# Patient Record
Sex: Male | Born: 1985 | Race: White | Hispanic: No | Marital: Married | Smoking: Never smoker
Health system: Southern US, Community
[De-identification: ages and names within clinical notes are randomized; demographics above are authoritative.]

## PROBLEM LIST (undated history)

## (undated) DIAGNOSIS — E785 Hyperlipidemia, unspecified: Secondary | ICD-10-CM

## (undated) DIAGNOSIS — I10 Essential (primary) hypertension: Secondary | ICD-10-CM

## (undated) DIAGNOSIS — F32A Depression, unspecified: Secondary | ICD-10-CM

## (undated) DIAGNOSIS — E119 Type 2 diabetes mellitus without complications: Secondary | ICD-10-CM

## (undated) HISTORY — DX: Hyperlipidemia, unspecified: E78.5

## (undated) HISTORY — DX: Depression, unspecified: F32.A

## (undated) HISTORY — PX: WISDOM TOOTH EXTRACTION: SHX21

## (undated) HISTORY — PX: C-EYE SURGERY PROCEDURE: 102257504

## (undated) HISTORY — PX: TONSILLECTOMY: SUR1361

## (undated) HISTORY — DX: Type 2 diabetes mellitus without complications: E11.9

## (undated) HISTORY — DX: Essential (primary) hypertension: I10

---

## 2010-05-09 HISTORY — PX: EYE SURGERY: SHX253

## 2017-07-09 ENCOUNTER — Emergency Department (HOSPITAL_COMMUNITY)
Admission: EM | Admit: 2017-07-09 | Discharge: 2017-07-09 | Disposition: A | Discharge status: Home / Self Care | Payer: 59 | Attending: Emergency Medicine | Admitting: Emergency Medicine

## 2017-07-09 ENCOUNTER — Emergency Department (HOSPITAL_COMMUNITY): Payer: 59

## 2017-07-09 ENCOUNTER — Encounter (HOSPITAL_COMMUNITY): Payer: Self-pay | Admitting: *Deleted

## 2017-07-09 DIAGNOSIS — R030 Elevated blood-pressure reading, without diagnosis of hypertension: Secondary | ICD-10-CM | POA: Insufficient documentation

## 2017-07-09 DIAGNOSIS — H05229 Edema of unspecified orbit: Secondary | ICD-10-CM | POA: Diagnosis present

## 2017-07-09 DIAGNOSIS — L03213 Periorbital cellulitis: Secondary | ICD-10-CM | POA: Diagnosis not present

## 2017-07-09 LAB — I-STAT CHEM 8, ED
BUN: 13 mg/dL (ref 6–20)
Calcium, Ion: 1.15 mmol/L (ref 1.15–1.40)
Chloride: 101 mmol/L (ref 101–111)
Creatinine, Ser: 1 mg/dL (ref 0.61–1.24)
GLUCOSE: 113 mg/dL — AB (ref 65–99)
HCT: 51 % (ref 39.0–52.0)
HEMOGLOBIN: 17.3 g/dL — AB (ref 13.0–17.0)
POTASSIUM: 3.9 mmol/L (ref 3.5–5.1)
SODIUM: 140 mmol/L (ref 135–145)
TCO2: 28 mmol/L (ref 22–32)

## 2017-07-09 MED ORDER — CLINDAMYCIN HCL 300 MG PO CAPS
300.0000 mg | ORAL_CAPSULE | Freq: Once | ORAL | Status: AC
  Administered 2017-07-09: 300 mg via ORAL
  Filled 2017-07-09: qty 1

## 2017-07-09 MED ORDER — CLINDAMYCIN HCL 300 MG PO CAPS: 300.0000 mg | ORAL_CAPSULE | Freq: Three times a day (TID) | ORAL | 0 refills | Status: AC

## 2017-07-09 MED ORDER — IOPAMIDOL (ISOVUE-300) INJECTION 61%
75.0000 mL | Freq: Once | INTRAVENOUS | Status: AC | PRN
  Administered 2017-07-09: 75 mL via INTRAVENOUS

## 2017-07-09 MED ORDER — IOPAMIDOL (ISOVUE-300) INJECTION 61%
INTRAVENOUS | Status: AC
  Administered 2017-07-09: 16:00:00
  Filled 2017-07-09: qty 75

## 2017-07-09 NOTE — Discharge Instructions (Signed)
Please see the information and instructions below regarding your visit.  Your diagnoses today include:  1. Preseptal cellulitis of left eye    Cellulitis is a superficial skin infection. Please take your antibiotics as prescribed for their ENTIRE prescribed duration.   Tests performed today include: See side panel of your discharge paperwork for testing performed today. Vital signs are listed at the bottom of these instructions.   Medications prescribed:    Take any prescribed medications only as prescribed, and any over the counter medications only as directed on the packaging.  1. Please take all of your antibiotics until finished.   You may develop abdominal discomfort or nausea from the antibiotic. If this occurs, you may take it with food. Some patients also get diarrhea with antibiotics. You may help offset this with probiotics which you can buy or get in yogurt. Do not eat or take the probiotics until 2 hours after your antibiotic. Some women develop vaginal yeast infections after antibiotics. If you develop unusual vaginal discharge after being on this medication, please see your primary care provider.   Some people develop allergies to antibiotics. Symptoms of antibiotic allergy can be mild and include a flat rash and itching. They can also be more serious and include:  ?Hives - Hives are raised, red patches of skin that are usually very itchy.  ?Lip or tongue swelling  ?Trouble swallowing or breathing  ?Blistering of the skin or mouth.  If you have any of these serious symptoms, please seek emergency medical care immediately.  Home care instructions:  Please follow any educational materials contained in this packet.   Please ensure that you apply warm compresses.  This will promote drainage.  Follow-up instructions: Please follow-up with Dr. Vanessa BarbaraZamora of ophthalmology tomorrow.  I discussed her case with him, and his office will contact you tomorrow.  I also sent your chart  to his in box.  Return instructions:  Please return to the Emergency Department if you experience worsening symptoms. Call your doctor sooner or return to the ER if you develop worsening signs of infection such as: increased redness, increased pain, pus, fever, or other symptoms that concern you. Please monitor the area we marked with a pen today. Please return if you have any other emergent concerns.  Additional Information:   Your vital signs today were: BP (!) 170/124 (BP Location: Left Arm)    Pulse 93    Temp 98.5 F (36.9 C) (Oral)    Ht 6' (1.829 m)    Wt 124.7 kg (275 lb)    SpO2 96%    BMI 37.30 kg/m  If your blood pressure (BP) was elevated on multiple readings during this visit above 130 for the top number or above 80 for the bottom number, please have this repeated by your primary care provider within one month. --------------  Thank you for allowing us to participate in your care today. It was a pleasure taking care of you today!

## 2017-07-09 NOTE — ED Notes (Signed)
Bed: WTR6 Expected date:  Expected time:  Means of arrival:  Comments: 

## 2017-07-09 NOTE — ED Provider Notes (Addendum)
[facility]-EMERGENCY DEPT Provider Note   CSN: [id number] Arrival date & time: 07/09/17  0932     History   Chief Complaint Chief Complaint  Patient presents with  . Abscess    HPI Bernadene BellMatthew Rackley is a 32 y.o. male.  HPI  Patient is a 32 year old male with a history of Crohn's keratosis of bilateral eyes presenting for periorbital swelling and erythema of the left eye.  Patient reports he has had swelling to the medial canthus for approximately 2 days, very rapidly progressed overnight.  Patient reports there is a fluctuant nodule in the right side of the eye.  Patient reports he has been recovering from an upper respiratory infection with sinus congestion.  Patient reports that he has had chronic sinus issues.  Patient denies fevers, chills, pain with extraocular movements, extraocular motion entrapment, retro-orbital pain.  eHistory reviewed. No pertinent past medical history.  There are no active problems to display for this patient.   History reviewed. No pertinent surgical history.     Home Medications    Prior to Admission medications   Not on File    Family History No family history on file.  Social History Social History   Tobacco Use  . Smoking status: Never Smoker  . Smokeless tobacco: Never Used  Substance Use Topics  . Alcohol use: Yes    Frequency: Never  . Drug use: Not on file     Allergies   Patient has no allergy information on record.   Review of Systems Review of Systems  Constitutional: Negative for chills and fever.  HENT: Positive for congestion, facial swelling and rhinorrhea.   Eyes: Positive for redness. Negative for visual disturbance.  Gastrointestinal: Negative for nausea and vomiting.  Neurological: Negative for headaches.     Physical Exam Updated Vital Signs BP (!) 170/124 (BP Location: Left Arm)   Pulse 93   Temp 98.5 F (36.9 C) (Oral)   Ht 6' (1.829 m)   Wt 124.7 kg (275 lb)   SpO2 96%    BMI 37.30 kg/m   Physical Exam  Constitutional: He appears well-developed and well-nourished. No distress.  Sitting comfortably in bed.  HENT:  Head: Normocephalic and atraumatic.  Eyes: Conjunctivae and EOM are normal. Pupils are equal, round, and reactive to light. Right eye exhibits no discharge. Left eye exhibits no discharge.  See clinical photo.  There is a fluctuant nodule at the medial canthus of the left eye.  Periorbital edema and erythema extends to the left upper lid.  No extraocular movement entrapment or pain with extraocular muscles.   Neck: Normal range of motion.  Cardiovascular: Normal rate and regular rhythm.  Intact, 2+ radial pulse.  Non-tachycardic.  Pulmonary/Chest:  Normal respiratory effort. Patient converses comfortably. No audible wheeze or stridor.  Abdominal: He exhibits no distension.  Musculoskeletal: Normal range of motion.  Neurological: He is alert.  Cranial nerves intact to gross observation. Patient moves extremities without difficulty.  Skin: Skin is warm and dry. He is not diaphoretic.  Psychiatric: He has a normal mood and affect. His behavior is normal. Judgment and thought content normal.  Nursing note and vitals reviewed.      ED Treatments / Results  Labs (all labs ordered are listed, but only abnormal results are displayed) Labs Reviewed - No data to display  EKG  EKG Interpretation None       Radiology No results found.  Procedures Procedures (including critical care time)  Medications Ordered in ED  Medications - No data to display   Initial Impression / Assessment and Plan / ED Course  I have reviewed the triage vital signs and the nursing notes.  Pertinent labs & imaging results that were available during my care of the patient were reviewed by me and considered in my medical decision making (see chart for details).    Patient is nontoxic-appearing, afebrile, and in no acute distress.  Patient exhibits left  periorbital erythema and edema as well as a nodular lesion concerning for possible fluid collection at the medial canthus.    Spoke with Dr. Vanessa Barbara of ophthalmology, who reviewed the clinical photos and recommended CT orbits as well as follow-up in the office tomorrow.  Patient was instructed that he is to follow-up, and that the office will contact him.  Patient given 1 dose of clindamycin in emergency department, and given prescription of clindamycin for preseptal cellulitis.  Patient given return precautions for any interim acute ocular movement entrapment, fevers, chills, rapid extension.  Site outlined in emergency department today.  CT scan, reviewed by me demonstrates preseptal cellulitis with no evidence of orbital involvement.  Patient exhibits retention cyst of the sinuses.  I noted the elevated blood pressure to the patient.  Patient reports that he and his wife monitor at home, and he will follow-up with his primary care provider.  Patient denies any visual changes other than the sensation in his left eye, headaches chest pain, shortness breath.  This is a shared visit with Dr. Alona Bene. Patient was independently evaluated by this attending physician. Attending physician consulted in evaluation and discharge management.  Final Clinical Impressions(s) / ED Diagnoses   Final diagnoses:  Preseptal cellulitis of left eye  Elevated blood-pressure reading without diagnosis of hypertension    ED Discharge Orders    None       Delia Chimes 07/09/17 1709    Elisha Ponder, PA-C 07/09/17 1709    Maia Plan, MD 07/09/17 2022

## 2017-07-09 NOTE — ED Triage Notes (Signed)
Pt complains of boil to the right of his left eye for the past 2 days. Pt states the area became swollen this morning, spreading to his left eyelid.

## 2017-07-10 ENCOUNTER — Ambulatory Visit (INDEPENDENT_AMBULATORY_CARE_PROVIDER_SITE_OTHER): Payer: 59 | Admitting: Ophthalmology

## 2017-07-10 ENCOUNTER — Encounter (INDEPENDENT_AMBULATORY_CARE_PROVIDER_SITE_OTHER): Payer: Self-pay | Admitting: Ophthalmology

## 2017-07-10 DIAGNOSIS — H3581 Retinal edema: Secondary | ICD-10-CM | POA: Diagnosis not present

## 2017-07-10 DIAGNOSIS — H18603 Keratoconus, unspecified, bilateral: Secondary | ICD-10-CM

## 2017-07-10 DIAGNOSIS — L03213 Periorbital cellulitis: Secondary | ICD-10-CM | POA: Diagnosis not present

## 2017-07-10 NOTE — Progress Notes (Signed)
[facility] - Clinic Note  07/10/2017     CHIEF COMPLAINT Patient presents for Retina Evaluation   HISTORY OF PRESENT ILLNESS: Francis Frank is a 32 y.o. male who presents to the clinic today for:   HPI    Retina Evaluation    In left eye.  This started 3 days ago.  Associated Symptoms Floaters.  Negative for Flashes, Pain, Trauma, Fever, Weight Loss, Scalp Tenderness, Redness, Distortion, Photophobia, Jaw Claudication, Fatigue, Shoulder/Hip pain, Glare and Blind Spot.  Context:  distance vision, mid-range vision and near vision.  Treatments tried include no treatments.  I, the attending physician,  performed the HPI with the patient and updated documentation appropriately.          Comments    Referral of [facility] , Dr. Mariel Aloe for eye issue.(Preseptal cellulitis). Patient states he noticed a sore spot coming up left side of his nose three days ago, he applied warm compresses to area, sore contiuned to get larger, he went to Ed yesterday and was given Clindamycin and to follow up appt with Dr. Vanessa Barbara . Pt reports he has a hx of Crohn's keratosis, pt had cornea scraping 2012. Pt reports he is not able to wear contact os. Pt reports occasional floaters os.  Denies ocular pain. Denies gtt's/vits       Last edited by Rennis Chris, MD on 07/10/2017  2:30 PM. 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 Charolette Forward in [place]; Pt reports having KCXL with Dr. Joice Lofts near [place]; Pt reports having intacs OS; Pt reports having mono VA;   Referring physician: No referring provider defined for this encounter.  HISTORICAL INFORMATION:   Selected notes from the MEDICAL RECORD NUMBER Referred by ED for preseptal cellulitis OS;  LEE-  Ocular Hx-  PMH-     CURRENT MEDICATIONS: No current outpatient medications on file. (Ophthalmic Drugs)   No current  facility-administered medications for this visit.  (Ophthalmic Drugs)   Current Outpatient Medications (Other)  Medication Sig  . clindamycin (CLEOCIN) 300 MG capsule Take 1 capsule (300 mg total) by mouth 3 (three) times daily for 7 days. You were given one dose in the emergency department.  Marland Kitchen FLUoxetine (PROZAC) 20 MG capsule Take 60 mg by mouth daily.   No current facility-administered medications for this visit.  (Other)      REVIEW OF SYSTEMS: ROS    Positive for: Genitourinary   Negative for: Constitutional, Gastrointestinal, Neurological, Skin, Musculoskeletal, HENT, Endocrine, Cardiovascular, Eyes, Respiratory, Psychiatric, Allergic/Imm, Heme/Lymph   Last edited by Eldridge Scot, LPN on 01/07/4781  1:58 PM. (History)       ALLERGIES No Known Allergies  PAST MEDICAL HISTORY History reviewed. No pertinent past medical history. Past Surgical History:  Procedure Laterality Date  . C-EYE SURGERY PROCEDURE    . EYE SURGERY  2012   cornea scrapping  . TONSILLECTOMY    . WISDOM TOOTH EXTRACTION      FAMILY HISTORY Family History  Problem Relation Age of Onset  . Hypertension Mother   . Diabetes Father   . Hypertension Father   . Diabetes Maternal Grandfather   . Stroke Maternal Grandfather   . Diabetes Paternal Grandfather     SOCIAL HISTORY Social History   Tobacco Use  . Smoking status: Never Smoker  . Smokeless tobacco: Never Used  Substance Use Topics  . Alcohol use: Yes    Alcohol/week: 0.6 oz    Types: 1 Cans of beer per week    Frequency: Never  . Drug use: No         OPHTHALMIC EXAM:  Base Eye Exam    Visual Acuity (Snellen - Linear)      Right Left   Dist [place] 20/100 20/150 -1   Dist ph [place] 20/30 -1 20/60 -2       Tonometry (Tonopen, 2:13 PM)      Right Left   Pressure 20 15       Pupils      Dark Light Shape React APD   Right 6 5 Round Brisk None   Left 6 5.5 Round Minimal None       Visual Fields (Counting fingers)      Left  Right    Full Full       Extraocular Movement      Right Left    Full, Ortho Full, Ortho       Neuro/Psych    Oriented x3:  Yes   Mood/Affect:  Normal       Dilation    Both eyes:  1.0% Mydriacyl, 2.5% Phenylephrine @ 2:20 PM        Slit Lamp and Fundus Exam    Slit Lamp Exam      Right Left   Lids/Lashes Normal Upper lid edema and erythema, draining absess medial canthus, lesions outlined in pen   Conjunctiva/Sclera White and quiet White and quiet   Cornea Clear, +KCN Clear with intacs in good position, no edema   Anterior Chamber Deep and quiet Deep and quiet   Iris Round and dilated Round and dilated   Lens clear; Trace cortical changes clear; Trace cortical changes   Vitreous Normal, clear Normal, clear       Fundus Exam      Right Left   Disc Normal Normal   C/D Ratio 0.2 0.2   Macula Good foveal reflex, No heme or edema Good foveal reflex, No heme or edema   Vessels Normal Normal   Periphery Attached Attached        Refraction    Manifest Refraction      Sphere Cylinder Axis Dist VA   Right -6.50 +4.75 144    Left -5.00 +3.25 032        Manifest Refraction #2      Sphere Cylinder Axis Dist VA   Right -6.75 +5.00 144 20/30   Left -5.00 +4.25 032 20/40          IMAGING AND PROCEDURES  Imaging and Procedures for 07/10/17  OCT, Retina - OU - Both Eyes     Right Eye Quality was good. Central Foveal Thickness: 243. Progression has no prior data. Findings include normal foveal contour, no IRF, no SRF.   Left Eye Quality was good. Central Foveal Thickness: 244. Progression has no prior data. Findings include normal foveal contour, no IRF, no SRF.   Notes *Images captured and stored on drive  Diagnosis / Impression:  NFP, No IRF/SRF OU  Clinical management:  See below  Abbreviations: NFP - Normal foveal profile. CME - cystoid macular edema. PED - pigment epithelial detachment. IRF - intraretinal fluid. SRF - subretinal fluid. EZ - ellipsoid  zone. ERM - epiretinal membrane. ORA - outer retinal atrophy. ORT - outer retinal tubulation. SRHM - subretinal hyper-reflective material  EXAM: CT ORBITS WITH CONTRAST (3.3.19)  TECHNIQUE: Multidetector CT images was performed according to the standard protocol following intravenous contrast administration.  CONTRAST:  75mL ISOVUE-300 IOPAMIDOL (ISOVUE-300) INJECTION 61%  COMPARISON:  None available.  FINDINGS: Orbits: Asymmetric soft tissue swelling involving the preseptal/periorbital soft tissues at the medial aspect of the left orbit noted, near the medial canthus (series 4, images 24-27). Mild swelling extends superiorly involving the left supraorbital soft tissues as well (series 4, image 16). Findings are nonspecific, but could reflect sequelae of acute preseptal cellulitis. No evidence for postseptal extension into the bony left orbit. No associated abnormality about the underlying nasolacrimal duct. No discrete abscess or drainable fluid collection identified.  Globes are symmetric in size with normal appearance and morphology bilaterally. Extra-ocular muscles normal in appearance. Optic nerves symmetric and normal. Intraconal and extraconal fat well-maintained. Lacrimal glands normal. Superior orbital veins within normal limits. No abnormality at the orbital apices.  Visualized sinuses: Few small retention cysts noted within the right sphenoid and maxillary sinuses. Visualized paranasal sinuses are otherwise clear. Small bilateral mastoid effusions, chronic in appearance.  Soft tissues: Remaining visualized soft tissues of the face are within normal limits. Visualized parotid glands within normal limits. No adenopathy within the visualized neck.  Limited intracranial: Unremarkable.  IMPRESSION: Focal soft tissue swelling involving the preseptal left periorbital soft tissues near the medial canthus, suspicious for possible acute preseptal  cellulitis. No evidence for postseptal extension at this time. No discrete abscess or drainable fluid collection.        ASSESSMENT/PLAN:    ICD-10-CM   1. Preseptal cellulitis of left eye L03.213   2. 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 Preseptal cellulitis OS -  - onset 07/08/17 -- abscess on medial canthus - CT orbits with contrast done in ED -- no post-septal / orbital involvement - started on clindamycin by ED on 07/09/17 - medial canthus abscess now draining and swelling and erythema decreased - no ocular or orbital involvement - pt okay to f/u with PCP, Dr. Midge AverPavelock -- consider culture of abscess - pt plans to f/u w/ PCP, can return here as needed  2. No retinal edema on exam or OCT  3. KCN OU - s/p Intacs OS by Dr. Joice Loftsornik - s/p corneal crosslinking by Dr. Joice Loftsornik - sees Dr. Charolette ForwardVernig in [place] - stable  Ophthalmic Meds Ordered this visit:  No orders of the defined types were placed in this encounter.      Return if symptoms worsen or fail to improve.  There are no Patient Instructions on file for this visit.   Explained the diagnoses, plan, and follow up with the patient and they expressed understanding.  Patient expressed understanding of the importance of proper follow up care.   This document serves as a record of services personally performed by Karie ChimeraBrian G. Latacha Texeira, MD, PhD. It was created on their behalf by Virgilio BellingMeredith Fabian, COA, a certified ophthalmic assistant. The creation of this record is the provider's dictation and/or activities during the visit.  Electronically signed by: Virgilio BellingMeredith Fabian, COA  07/10/17 3:04 PM    Karie ChimeraBrian G. Lulani Bour, M.D., Ph.D. Diseases & Surgery of the Retina and Vitreous [facility] 07/10/17   I have reviewed the above documentation for accuracy and completeness, and I agree with the above. Karie ChimeraBrian G. Ojas Coone, M.D., Ph.D. 07/10/17 3:04  PM     Abbreviations: Judie PetitM myopia (  nearsighted); A astigmatism; H hyperopia (farsighted); P presbyopia; Mrx spectacle prescription;  CTL contact lenses; OD right eye; OS left eye; OU both eyes  XT exotropia; ET esotropia; PEK punctate epithelial keratitis; PEE punctate epithelial erosions; DES dry eye syndrome; MGD meibomian gland dysfunction; ATs artificial tears; PFAT's preservative free artificial tears; [place] nuclear sclerotic cataract; PSC posterior subcapsular cataract; ERM epi-retinal membrane; PVD posterior vitreous detachment; RD retinal detachment; DM diabetes mellitus; DR diabetic retinopathy; NPDR non-proliferative diabetic retinopathy; PDR proliferative diabetic retinopathy; CSME clinically significant macular edema; DME diabetic macular edema; dbh dot blot hemorrhages; CWS cotton wool spot; POAG primary open angle glaucoma; C/D cup-to-disc ratio; HVF humphrey visual field; GVF goldmann visual field; OCT optical coherence tomography; IOP intraocular pressure; BRVO Branch retinal vein occlusion; CRVO central retinal vein occlusion; CRAO central retinal artery occlusion; BRAO branch retinal artery occlusion; RT retinal tear; SB scleral buckle; PPV pars plana vitrectomy; VH Vitreous hemorrhage; PRP panretinal laser photocoagulation; IVK intravitreal kenalog; VMT vitreomacular traction; MH Macular hole;  NVD neovascularization of the disc; NVE neovascularization elsewhere; AREDS age related eye disease study; ARMD age related macular degeneration; POAG primary open angle glaucoma; EBMD epithelial/anterior basement membrane dystrophy; ACIOL anterior chamber intraocular lens; IOL intraocular lens; PCIOL posterior chamber intraocular lens; Phaco/IOL phacoemulsification with intraocular lens placement; [place] photorefractive keratectomy; LASIK laser assisted in situ keratomileusis; HTN hypertension; DM diabetes mellitus; COPD chronic obstructive pulmonary disease

## 2018-08-15 IMAGING — CT CT ORBITS W/ CM
3 series · 14 of 47 positions shown, 16 images · IV contrast (ISOVUE)
Comparison: None available.

CLINICAL DATA: Initial evaluation for acute periorbital cellulitis.

EXAM:
CT ORBITS WITH CONTRAST
TECHNIQUE: Multidetector CT images was performed according to the standard
protocol following intravenous contrast administration.
CONTRAST:  75mL 8J4DR1-3MM IOPAMIDOL (8J4DR1-3MM) INJECTION 61%

[Series 4: orbits 2.0 h30s st · axial · 0.37mm/px · z∈[-156,-58]mm · 8 of 57 slices shown, 10 images]
[im 4/57  brain]
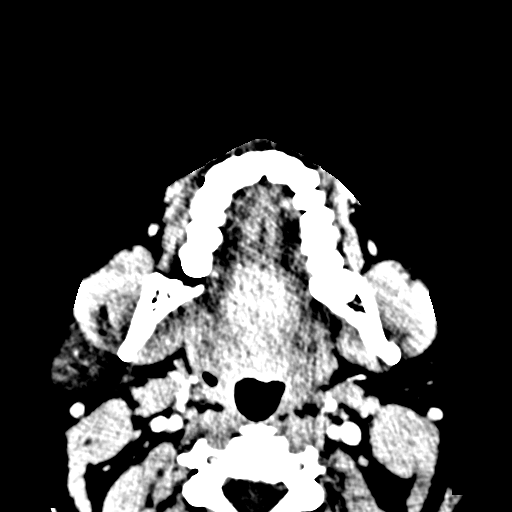
[im 4/57  bone]
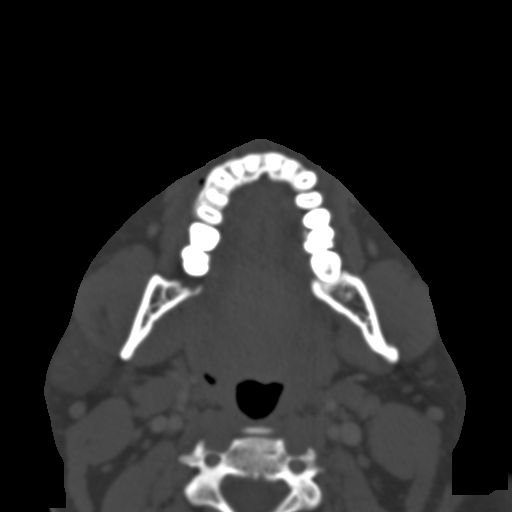
[im 12/57  bone]
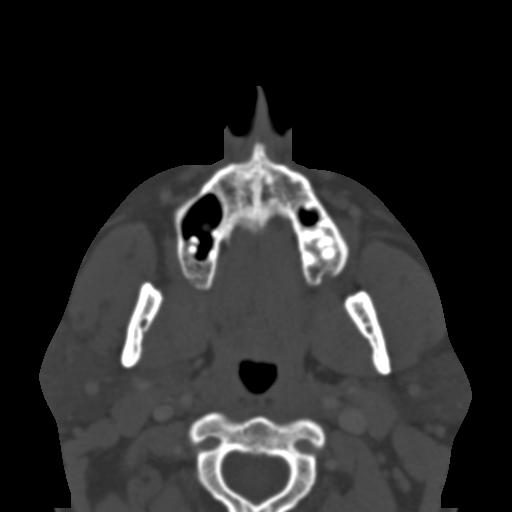
[im 18/57  bone]
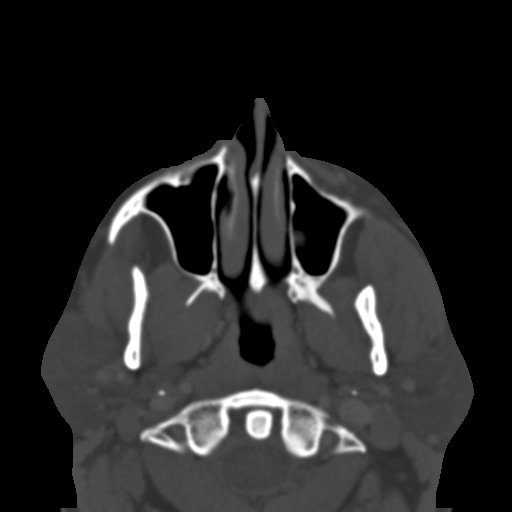
[im 26/57  bone]
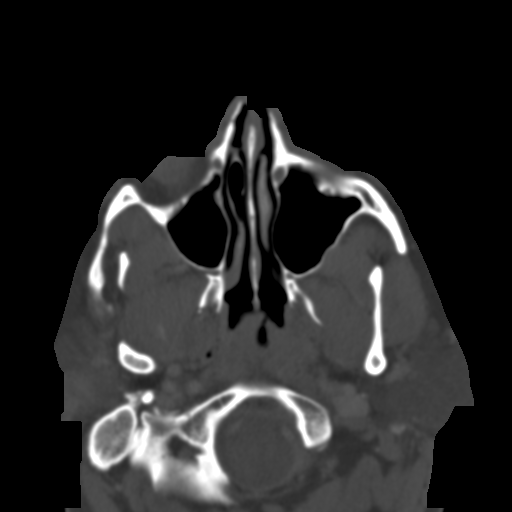
[im 31/57  brain]
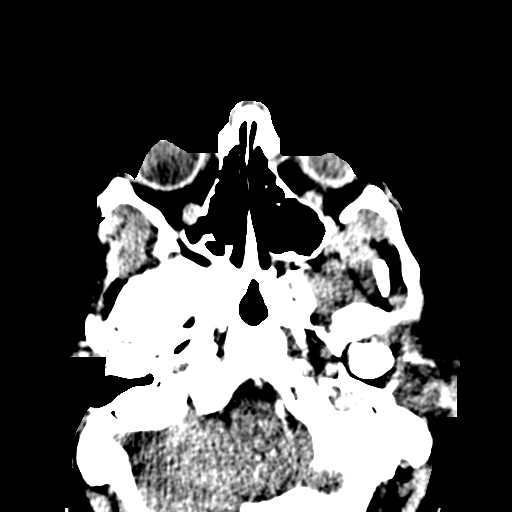
[im 31/57  bone]
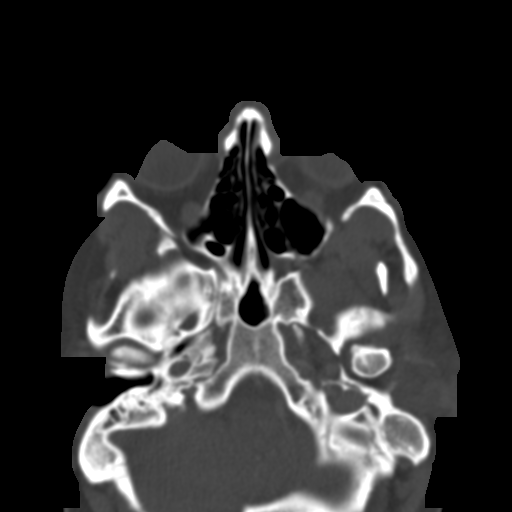
[im 39/57  bone]
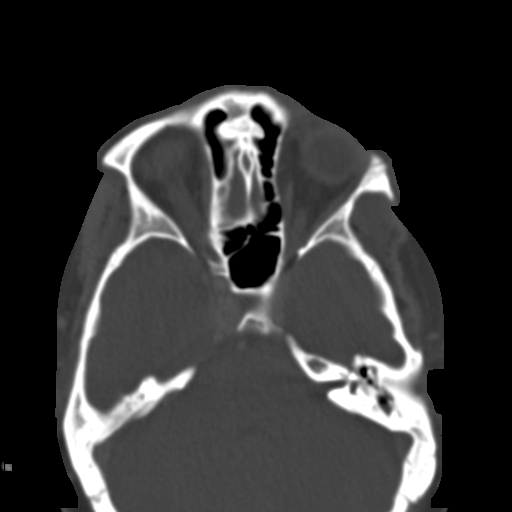
[im 45/57  bone]
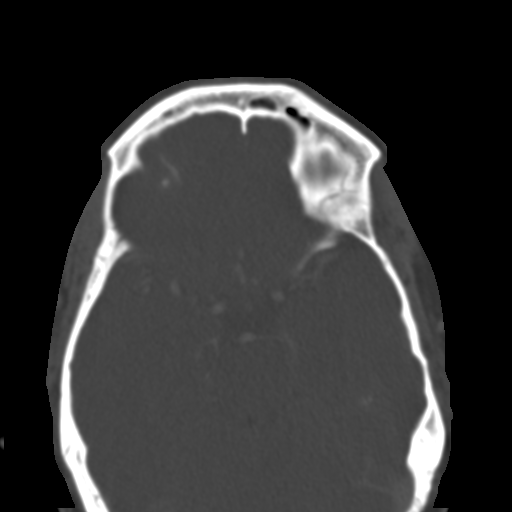
[im 53/57  bone]
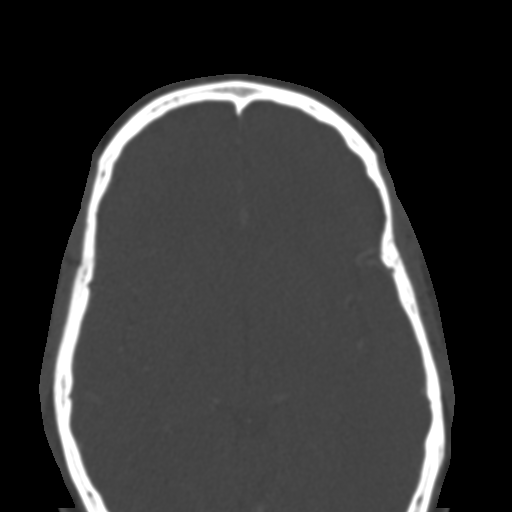

[Series 9: orbits st coronal · coronal · 0.26mm/px · 3 of 92 slices shown]
[im 31/92  bone]
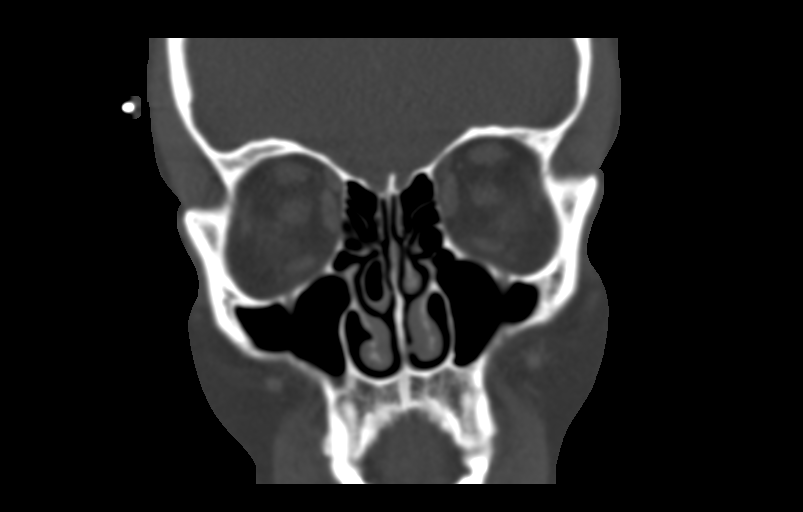
[im 41/92  bone]
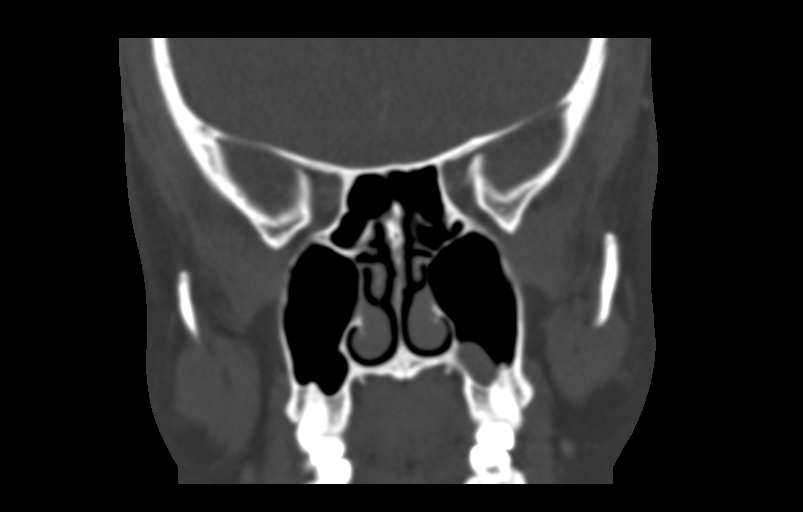
[im 51/92  bone]
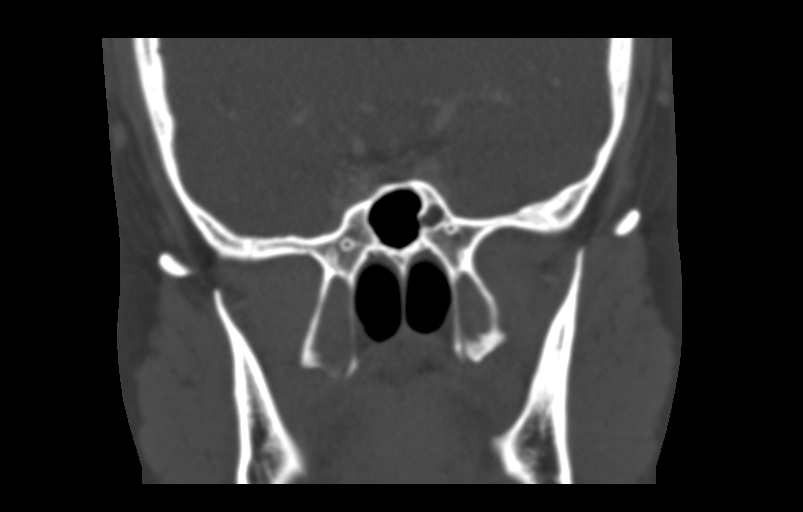

[Series 10: orbits st sagittal · sagittal · 0.25mm/px · 3 of 93 slices shown]
[im 31/93  bone]
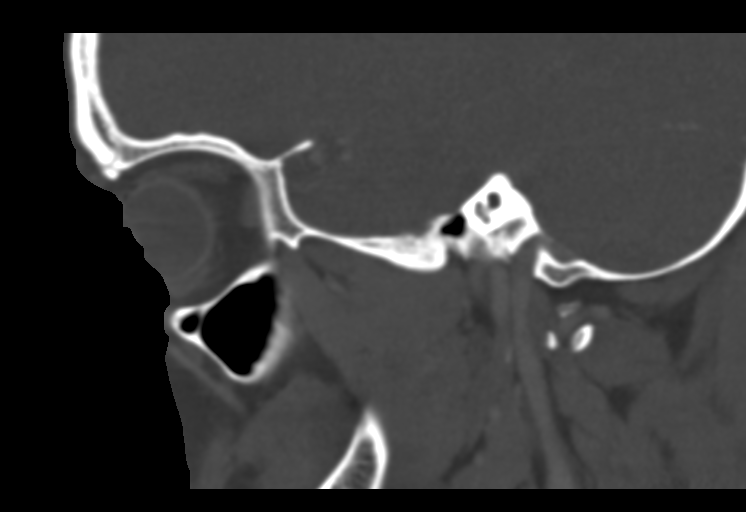
[im 47/93  bone]
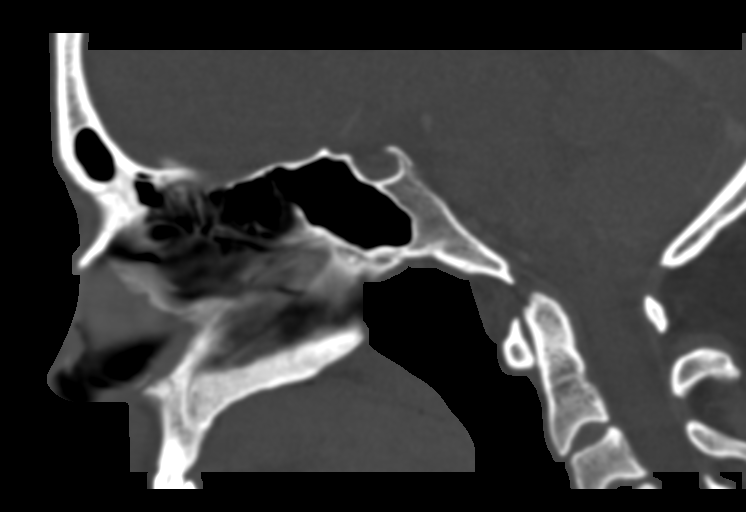
[im 62/93  bone]
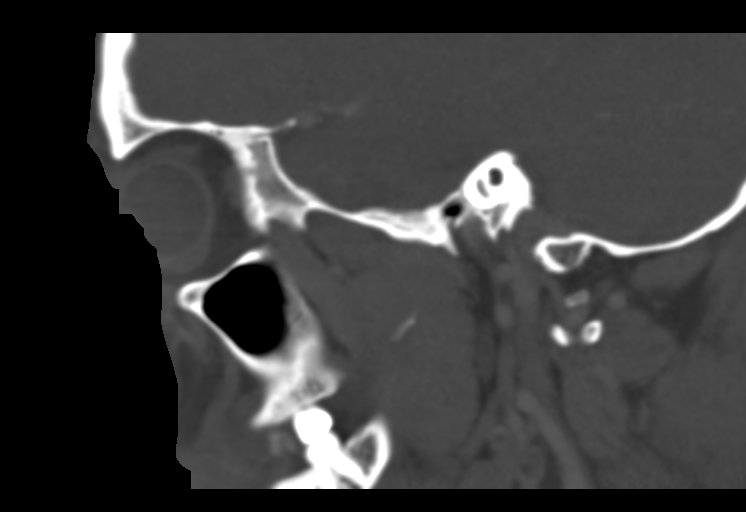

[14 of 47 positions shown; findings below may reference images not displayed]

FINDINGS: Orbits: Asymmetric soft tissue swelling involving the
preseptal/periorbital soft tissues at the medial aspect of the left
orbit noted, near the medial canthus (series 4, images 24-27). Mild
swelling extends superiorly involving the left supraorbital soft
tissues as well (series 4, image 16). Findings are nonspecific, but
could reflect sequelae of acute preseptal cellulitis. No evidence
for postseptal extension into the bony left orbit. No associated
abnormality about the underlying nasolacrimal duct. No discrete
abscess or drainable fluid collection identified.

Globes are symmetric in size with normal appearance and morphology
bilaterally. Extra-ocular muscles normal in appearance. Optic nerves
symmetric and normal. Intraconal and extraconal fat well-maintained.
Lacrimal glands normal. Superior orbital veins within normal limits.
No abnormality at the orbital apices.

Visualized sinuses: Few small retention cysts noted within the right
sphenoid and maxillary sinuses. Visualized paranasal sinuses are
otherwise clear. Small bilateral mastoid effusions, chronic in
appearance.

Soft tissues: Remaining visualized soft tissues of the face are
within normal limits. Visualized parotid glands within normal
limits. No adenopathy within the visualized neck.

Limited intracranial: Unremarkable.
IMPRESSION: Focal soft tissue swelling involving the preseptal left periorbital
soft tissues near the medial canthus, suspicious for possible acute
preseptal cellulitis. No evidence for postseptal extension at this
time. No discrete abscess or drainable fluid collection.

## 2020-10-07 ENCOUNTER — Ambulatory Visit: Payer: No Typology Code available for payment source | Admitting: Neurology

## 2020-10-07 ENCOUNTER — Encounter: Payer: Self-pay | Admitting: Neurology

## 2020-10-07 ENCOUNTER — Other Ambulatory Visit: Payer: Self-pay

## 2020-10-07 VITALS — BP 144/84 | HR 84 | Ht 72.0 in | Wt 255.0 lb

## 2020-10-07 DIAGNOSIS — E669 Obesity, unspecified: Secondary | ICD-10-CM

## 2020-10-07 DIAGNOSIS — G2581 Restless legs syndrome: Secondary | ICD-10-CM

## 2020-10-07 DIAGNOSIS — E66811 Obesity, class 1: Secondary | ICD-10-CM

## 2020-10-07 DIAGNOSIS — G478 Other sleep disorders: Secondary | ICD-10-CM

## 2020-10-07 DIAGNOSIS — R0683 Snoring: Secondary | ICD-10-CM

## 2020-10-07 DIAGNOSIS — R0681 Apnea, not elsewhere classified: Secondary | ICD-10-CM

## 2020-10-07 DIAGNOSIS — G4763 Sleep related bruxism: Secondary | ICD-10-CM | POA: Diagnosis not present

## 2020-10-07 NOTE — Patient Instructions (Signed)

## 2020-10-07 NOTE — Progress Notes (Signed)
Subjective:    Patient ID: Francis Frank is a 35 y.o. male.  HPI     Huston Foley, MD, PhD [facility] [address], [address] [address] [place], [place] [address]  Dear Dr. Santa Lighter,   I saw your patient, Francis Frank, upon your kind request in my sleep clinic today for initial consultation of his sleep disorder, in particular, concern for underlying obstructive sleep apnea.  Patient is unaccompanied today.  As you know, Francis Frank is a 35 yo male with an underlying medical history of hyperlipidemia, hypertension, allergies, eczema, elevated liver enzymes, diabetes, allergies, depression and obesity, who reports snoring and excessive daytime somnolence.  I reviewed your office note from 06/16/2020.  He had blood work through your office on 06/13/2020 and I was able to review the results: A1c was 5.9, vitamin D 33 on the low normal side, TSH normal at 2.49, total testosterone in the normal range, CBC with differential showed RBC of 5.87, hemoglobin 16.9, hematocrit elevated at 52.2.  His snoring has improved as he has lost weight.  He has lost about 30 pounds thus far.  He continues to work on weight loss.  His Epworth sleepiness score is 3 out of 24, fatigue severity score is 23 out of 63.  He is not aware of any family history of sleep apnea.  He has had the occasional breathing pauses but not recently.  He does not necessarily wake up gasping for air.  He has occasional restless leg symptoms and has some trouble maintaining sleep.  His bedtime is generally around 1030 or 11 PM and rise time around 730.  He does not have night to night nocturia or recurrent morning headaches.  He had a tonsillectomy in college.  He has a history of bruxism and uses a bite guard on most nights.  He lives with his wife, they have 2 dogs in the household, he works in Administrator, sports.  He saw an ENT specialist and had a CT of the sinuses.  He was told that he had a deviated septum but  surgery was not recommended.  He drinks caffeine in the form of coffee, typically 1 cup/day.  He drinks alcohol occasionally, he is a non-smoker.  His Past Medical History Is Significant For: Past Medical History:  Diagnosis Date  . Depression   . Diabetes mellitus without complication (HCC)   . HLD (hyperlipidemia)   . Hypertension     His Past Surgical History Is Significant For: Past Surgical History:  Procedure Laterality Date  . C-EYE SURGERY PROCEDURE    . EYE SURGERY  2012   cornea scrapping  . TONSILLECTOMY    . WISDOM TOOTH EXTRACTION      His Family History Is Significant For: Family History  Problem Relation Age of Onset  . Hypertension Mother   . Diabetes Father   . Hypertension Father   . Diabetes Maternal Grandfather   . Stroke Maternal Grandfather   . Diabetes Paternal Grandfather     His Social History Is Significant For: Social History   Socioeconomic History  . Marital status: Married    Spouse name: Not on file  . Number of children: Not on file  . Years of education: Not on file  . Highest education level: Not on file  Occupational History  . Occupation: market  Tobacco Use  . Smoking status: Never Smoker  . Smokeless tobacco: Never Used  Vaping Use  . Vaping Use: Never used  Substance and  Sexual Activity  . Alcohol use: Yes    Alcohol/week: 1.0 standard drink    Types: 1 Cans of beer per week    Comment: socially  . Drug use: No  . Sexual activity: Not on file  Other Topics Concern  . Not on file  Social History Narrative  . Not on file   Social Determinants of Health   Financial Resource Strain: Not on file  Food Insecurity: Not on file  Transportation Needs: Not on file  Physical Activity: Not on file  Stress: Not on file  Social Connections: Not on file    His Allergies Are:  Allergies  Allergen Reactions  . Sulfa Antibiotics Nausea Only  :   His Current Medications Are:  Outpatient Encounter Medications as of  10/07/2020  Medication Sig  . amLODipine-valsartan (EXFORGE) 10-320 MG tablet Take 1 tablet by mouth daily.  Marland Kitchen. atorvastatin (LIPITOR) 40 MG tablet Take 1 tablet by mouth daily.  Marland Kitchen. buPROPion (WELLBUTRIN XL) 150 MG 24 hr tablet Take 1 tablet by mouth every morning.  Marland Kitchen. icosapent Ethyl (VASCEPA) 1 g capsule Take 2 g by mouth 2 (two) times daily.  Marland Kitchen. loratadine (CLARITIN) 10 MG tablet Take 10 mg by mouth daily.  . mometasone (ELOCON) 0.1 % cream SMARTSIG:Sparingly Topical Daily  . montelukast (SINGULAIR) 10 MG tablet Take 1 tablet by mouth daily.  . RYBELSUS 3 MG TABS Take 1 tablet by mouth every morning.  Marland Kitchen. SYNJARDY 12.09-998 MG TABS Take 2 tablets by mouth daily.  . [DISCONTINUED] FLUoxetine (PROZAC) 20 MG capsule Take 60 mg by mouth daily.   No facility-administered encounter medications on file as of 10/07/2020.  :   Review of Systems:  Out of a complete 14 point review of systems, all are reviewed and negative with the exception of these symptoms as listed below: Review of Systems  Neurological:       Pt presents today to evaluate sleep concerns could be causing problems with getting solid sleep. He states he wakes up every night at least 1 time. He has never had a SS. Used to snore but after weight loss and taking allergy meds at bedtime that has cleared up unless he is on his back. He has noticed that when he checks his blood sugar it seems to be highest when he wakes in the morning.  Epworth Sleepiness Scale 0= would never doze 1= slight chance of dozing 2= moderate chance of dozing 3= high chance of dozing  Sitting and reading:0 Watching TV:0 Sitting inactive in a public place (ex. Theater or meeting):0 As a passenger in a car for an hour without a break:1 Lying down to rest in the afternoon:1 Sitting and talking to someone:0 Sitting quietly after lunch (no alcohol):1 In a car, while stopped in traffic:0 Total:3     Objective:  Neurological Exam  Physical Exam Physical  Examination:   Vitals:   10/07/20 1524  BP: (!) 144/84  Pulse: 84    General Examination: The patient is a very pleasant 35 y.o. male in no acute distress. He appears well-developed and well-nourished and well groomed.   HEENT: Normocephalic, atraumatic, pupils are equal, round and reactive to light, extraocular tracking is good without limitation to gaze excursion or nystagmus noted. Hearing is grossly intact. Face is symmetric with normal facial animation. Speech is clear with no dysarthria noted. There is no hypophonia. There is no lip, neck/head, jaw or voice tremor. Neck is supple with full range of passive and active motion.  There are no carotid bruits on auscultation. Oropharynx exam reveals: mild mouth dryness, adequate dental hygiene and mild airway crowding, due to prominent uvula, Mallampati class II.  Neck circumference of 17 and three-quarter inches.  Tongue protrudes centrally and palate elevates symmetrically, tonsils absent.  He has a mild to moderate overbite.  Nasal inspection reveals deviated septum to the right.  Chest: Clear to auscultation without wheezing, rhonchi or crackles noted.  Heart: S1+S2+0, regular and normal without murmurs, rubs or gallops noted.   Abdomen: Soft, non-tender and non-distended.  Extremities: There is Nonpitting puffiness noticed of both distal lower extremities.  Skin: Warm and dry with patchy skin lesions in both shins, eczematous changes.    Musculoskeletal: exam reveals no obvious joint deformities, tenderness or joint swelling or erythema.   Neurologically:  Mental status: The patient is awake, alert and oriented in all 4 spheres. His immediate and remote memory, attention, language skills and fund of knowledge are appropriate. There is no evidence of aphasia, agnosia, apraxia or anomia. Speech is clear with normal prosody and enunciation. Thought process is linear. Mood is normal and affect is normal.  Cranial nerves II - XII are as  described above under HEENT exam.  Motor exam: Normal bulk, strength and tone is noted. There is no tremor, Romberg is negative. Fine motor skills and coordination: grossly intact.  Cerebellar testing: No dysmetria or intention tremor. There is no truncal or gait ataxia.  Sensory exam: intact to light touch in the upper and lower extremities.  Gait, station and balance: He stands easily. No veering to one side is noted. No leaning to one side is noted. Posture is age-appropriate and stance is narrow based. Gait shows normal stride length and normal pace. No problems turning are noted. Tandem walk is unremarkable.                Assessment and plan:  In summary, Sava Proby is a very pleasant 35 y.o.-year old male with an underlying medical history of hyperlipidemia, hypertension, allergies, eczema, elevated liver enzymes, diabetes, allergies, depression and obesity, whose history and physical exam are concerning for obstructive sleep apnea (OSA). I had a long chat with the patient about my findings and the diagnosis of OSA, its prognosis and treatment options. We talked about medical treatments, surgical interventions and non-pharmacological approaches. I explained in particular the risks and ramifications of untreated moderate to severe OSA, especially with respect to developing cardiovascular disease down the Road, including congestive heart failure, difficult to treat hypertension, cardiac arrhythmias, or stroke. Even type 2 diabetes has, in part, been linked to untreated OSA. Symptoms of untreated OSA include daytime sleepiness, memory problems, mood irritability and mood disorder such as depression and anxiety, lack of energy, as well as recurrent headaches, especially morning headaches. We talked about trying to maintain a healthy lifestyle in general, as well as the importance of weight control. We also talked about the importance of good sleep hygiene. I recommended the following at this time:  sleep study.   I explained the sleep test procedure to the patient and also outlined possible surgical and non-surgical treatment options of OSA, including the use of a custom-made dental device (which would require a referral to a specialist dentist or oral surgeon), upper airway surgical options, such as traditional UPPP or a novel less invasive surgical option in the form of Inspire hypoglossal nerve stimulation (which would involve a referral to an ENT surgeon). I also explained the CPAP treatment option to the patient,  who indicated that he would be willing to try CPAP if the need arises. I explained the importance of being compliant with PAP treatment, not only for insurance purposes but primarily to improve Her symptoms, and for the patient's long term health benefit, including to reduce His cardiovascular risks. I answered all his questions today and the patient was in agreement. I plan to see him back after the sleep study is completed and encouraged him to call with any interim questions, concerns, problems or updates.   Thank you very much for allowing me to participate in the care of this nice patient. If I can be of any further assistance to you please do not hesitate to call me at [phone].  Sincerely,   Huston Foley, MD, PhD

## 2020-10-28 ENCOUNTER — Encounter: Payer: Self-pay | Admitting: Neurology

## 2020-11-05 ENCOUNTER — Telehealth: Payer: Self-pay

## 2020-11-05 NOTE — Telephone Encounter (Signed)
LVM for pt to call me back to schedule sleep study  

## 2020-11-10 ENCOUNTER — Ambulatory Visit (INDEPENDENT_AMBULATORY_CARE_PROVIDER_SITE_OTHER): Payer: No Typology Code available for payment source | Admitting: Neurology

## 2020-11-10 DIAGNOSIS — G478 Other sleep disorders: Secondary | ICD-10-CM

## 2020-11-10 DIAGNOSIS — R0683 Snoring: Secondary | ICD-10-CM

## 2020-11-10 DIAGNOSIS — R0681 Apnea, not elsewhere classified: Secondary | ICD-10-CM

## 2020-11-10 DIAGNOSIS — G4733 Obstructive sleep apnea (adult) (pediatric): Secondary | ICD-10-CM

## 2020-11-10 DIAGNOSIS — G4763 Sleep related bruxism: Secondary | ICD-10-CM

## 2020-11-10 DIAGNOSIS — G2581 Restless legs syndrome: Secondary | ICD-10-CM

## 2020-11-10 DIAGNOSIS — E669 Obesity, unspecified: Secondary | ICD-10-CM

## 2020-11-12 NOTE — Progress Notes (Signed)
See procedure note.

## 2020-11-12 NOTE — Addendum Note (Signed)
Addended by: Huston Foley on: 11/12/2020 03:22 PM   Modules accepted: Orders

## 2020-11-12 NOTE — Procedures (Signed)
   [facility]  HOME SLEEP TEST (Watch PAT) REPORT  STUDY DATE: 11/10/20  DOB: June 25, 1985  MRN: [id number]  ORDERING CLINICIAN: Huston Foley, MD, PhD   REFERRING CLINICIAN: Dr. Santa Lighter  CLINICAL INFORMATION/HISTORY: 35 year-old male with a history of hyperlipidemia, hypertension, allergies, eczema, elevated liver enzymes, diabetes, allergies, depression and obesity, who reports snoring and excessive daytime somnolence.  Epworth sleepiness score: 3/24.  BMI: 34.58 kg/m  FINDINGS:   Sleep Summary:   Total Recording Time (hours, min): 8 h, 10 min  Total Sleep Time (hours, min):  6 h, 3 min   Percent REM (%):    23.9%   Respiratory Indices:   Calculated pAHI (per hour):  13.3/hour         REM pAHI:    30.4/hour       NREM pAHI: 8/hour  Oxygen Saturation Statistics:    Oxygen Saturation (%) Mean: 93%   Minimum oxygen saturation (%):                 87%   O2 Saturation Range (%): 87 - 99%    O2 Saturation (minutes) <=88%: 0.7 min  Pulse Rate Statistics:   Pulse Mean (bpm):    68/min    Pulse Range (45 - 104/min)   IMPRESSION: OSA (obstructive sleep apnea), mild  RECOMMENDATION:  This home sleep test demonstrates overall mild obstructive sleep apnea with a total AHI of 13.3/hour and O2 nadir of 87%. Given the patient's medical history and sleep related complaints, treatment with positive airway pressure is recommended. This can be achieved in the form of autoPAP trial/titration at home. A full night CPAP titration study will help with proper treatment settings and mask fitting, if needed, down the road. Alternative treatments may include weight loss along with avoidance of the supine sleep position, or an oral appliance in appropriate candidates.   Please note that untreated obstructive sleep apnea may carry additional perioperative morbidity. Patients with significant obstructive sleep apnea should receive perioperative PAP therapy and the  surgeons and particularly the anesthesiologist should be informed of the diagnosis and the severity of the sleep disordered breathing. The patient should be cautioned not to drive, work at heights, or operate dangerous or heavy equipment when tired or sleepy. Review and reiteration of good sleep hygiene measures should be pursued with any patient. Other causes of the patient's symptoms, including circadian rhythm disturbances, an underlying mood disorder, medication effect and/or an underlying medical problem cannot be ruled out based on this test. Clinical correlation is recommended.   The patient and his referring provider will be notified of the test results. The patient will be seen in follow up in sleep clinic at [facility].  I certify that I have reviewed the raw data recording prior to the issuance of this report in accordance with the standards of the American Academy of Sleep Medicine (AASM).  INTERPRETING PHYSICIAN:   Huston Foley, MD, PhD  Board Certified in Neurology and Sleep Medicine  [facility] [address] [place], [place] [address] [phone]

## 2020-11-16 ENCOUNTER — Encounter: Payer: Self-pay | Admitting: *Deleted

## 2020-11-16 ENCOUNTER — Telehealth: Payer: Self-pay | Admitting: *Deleted

## 2020-11-16 NOTE — Telephone Encounter (Signed)
Fax confirmation received for pts sleep study to Dr. Santa Lighter [phone], fax [phone].

## 2020-11-16 NOTE — Telephone Encounter (Signed)
Francis Ludwig, RN Got it!   Thanks!         Previous Messages    ----- Message -----  From: Francis Begin, RN  Sent: 11/16/2020  11:45 AM EDT  To: [name]  Subject: new cpap user                                   Francis Bell "Matt"   Male, 35 y.o., December 19, 1985   MRN:  [id number]  New autopap order.  Francis Frank

## 2021-01-25 ENCOUNTER — Telehealth: Payer: Self-pay | Admitting: Neurology

## 2021-01-25 ENCOUNTER — Ambulatory Visit: Payer: Self-pay | Admitting: Neurology

## 2021-01-25 NOTE — Telephone Encounter (Signed)
FYI- Pt called had to reschedule his appt due to not having his chip for his CPAP machine.

## 2021-02-02 ENCOUNTER — Encounter: Payer: Self-pay | Admitting: *Deleted

## 2021-02-03 ENCOUNTER — Encounter: Payer: Self-pay | Admitting: Neurology

## 2021-02-03 ENCOUNTER — Ambulatory Visit: Payer: No Typology Code available for payment source | Admitting: Neurology

## 2021-02-03 VITALS — BP 113/72 | HR 68 | Ht 72.0 in | Wt 267.4 lb

## 2021-02-03 DIAGNOSIS — Z9989 Dependence on other enabling machines and devices: Secondary | ICD-10-CM

## 2021-02-03 DIAGNOSIS — G4733 Obstructive sleep apnea (adult) (pediatric): Secondary | ICD-10-CM | POA: Diagnosis not present

## 2021-02-03 NOTE — Progress Notes (Signed)
Subjective:    Patient ID: Francis Frank is a 35 y.o. male.  HPI    Interim history:   Francis Frank is a 35 year-old male with an underlying medical history of hyperlipidemia, hypertension, allergies, eczema, elevated liver enzymes, diabetes, allergies, depression and obesity, who presents for follow-up consultation of his obstructive sleep apnea after interim home sleep testing and starting AutoPap therapy.  The patient is unaccompanied today.  He missed an appointment on 01/25/21. I first met him on 10/07/2020, at which time he reported snoring and excessive daytime somnolence, as well as occasional restless leg symptoms and some trouble maintaining sleep.  He was advised to proceed with sleep testing.  He had a home sleep test on 11/10/2020 which indicated overall mild obstructive sleep apnea with an AHI of 13.3/h, O2 nadir 87%.  He was advised to start AutoPap therapy.  His set up date was 12/21/2020.  He has a Resvent machine.  Today, 02/03/2021: I reviewed his AutoPap compliance data from 12/21/2020 through 01/20/2021, which is a total of 31 days, during which time he used his machine 28 days with percent use days greater than 4 hours at 71%, indicating adequate compliance with an average usage of 5.6 hours, residual AHI at goal at 0.8/h, 95th percentile of pressure at 4.8 cm with a range of 4 to 12 cm, leak on the low side with a 95th percentile at 0.7 L/min.  In the past month his usage shows a few lapses.  He reports that he has had more traveling and did not use it then.  He is motivated to continue with treatment.  His average usage in the past month has increased to 6.5 hours, residual AHI 0.4/h, average pressure 4.5 cm, leak on the low side consistently.  Percent use days greater than 4 hours at 50% in the past 30 days which is suboptimal.  He reports that it took him a while to get adjusted to treatment.  He is using a small fullface mask.  He is feeling some benefit from treating sleep apnea,  snoring is less and if wakes up more energized, has less daytime tiredness.  He has had some medication changes lately, is now on glipizide, Jardiance and metformin.  He is no longer on Synjardy and Rybelsus.  He felt that the previous medication regimen worked a little better for him and also helped with weight loss a little better.  He may talk to his provider about switching back to his regimen for his diabetes.  He has not yet changed any of his supplies.  He is good about using clean, distilled water in the humidifier chamber.  The patient's allergies, current medications, family history, past medical history, past social history, past surgical history and problem list were reviewed and updated as appropriate.   Previously:   10/07/20: (He) reports snoring and excessive daytime somnolence.  I reviewed your office note from 06/16/2020.  He had blood work through your office on 06/13/2020 and I was able to review the results: A1c was 5.9, vitamin D 33 on the low normal side, TSH normal at 2.49, total testosterone in the normal range, CBC with differential showed RBC of 5.87, hemoglobin 16.9, hematocrit elevated at 52.2.  His snoring has improved as he has lost weight.  He has lost about 30 pounds thus far.  He continues to work on weight loss.  His Epworth sleepiness score is 3 out of 24, fatigue severity score is 23 out of 63.  He is  not aware of any family history of sleep apnea.  He has had the occasional breathing pauses but not recently.  He does not necessarily wake up gasping for air.  He has occasional restless leg symptoms and has some trouble maintaining sleep.  His bedtime is generally around 1030 or 11 PM and rise time around 730.  He does not have night to night nocturia or recurrent morning headaches.  He had a tonsillectomy in college.  He has a history of bruxism and uses a bite guard on most nights.  He lives with his wife, they have 2 dogs in the household, he works in [employer], [employer].  He saw  an ENT specialist and had a CT of the sinuses.  He was told that he had a deviated septum but surgery was not recommended.  He drinks caffeine in the form of coffee, typically 1 cup/day.  He drinks alcohol occasionally, he is a non-smoker.  His Past Medical History Is Significant For: Past Medical History:  Diagnosis Date   Depression    Diabetes mellitus without complication (HCC)    HLD (hyperlipidemia)    Hypertension     His Past Surgical History Is Significant For: Past Surgical History:  Procedure Laterality Date   C-EYE SURGERY PROCEDURE     EYE SURGERY  2012   cornea scrapping   TONSILLECTOMY     WISDOM TOOTH EXTRACTION      His Family History Is Significant For: Family History  Problem Relation Age of Onset   Hypertension Mother    Diabetes Father    Hypertension Father    Diabetes Maternal Grandfather    Stroke Maternal Grandfather    Diabetes Paternal Grandfather     His Social History Is Significant For: Social History   Socioeconomic History   Marital status: Married    Spouse name: Not on file   Number of children: Not on file   Years of education: Not on file   Highest education level: Not on file  Occupational History   Occupation: market  Tobacco Use   Smoking status: Never   Smokeless tobacco: Never  Vaping Use   Vaping Use: Never used  Substance and Sexual Activity   Alcohol use: Yes    Alcohol/week: 1.0 standard drink    Types: 1 Cans of beer per week    Comment: socially   Drug use: No   Sexual activity: Not on file  Other Topics Concern   Not on file  Social History Narrative   Not on file   Social Determinants of Health   Financial Resource Strain: Not on file  Food Insecurity: Not on file  Transportation Needs: Not on file  Physical Activity: Not on file  Stress: Not on file  Social Connections: Not on file    His Allergies Are:  Allergies  Allergen Reactions   Sulfa Antibiotics Nausea Only  :   His Current  Medications Are:  Outpatient Encounter Medications as of 02/03/2021  Medication Sig   amLODipine-valsartan (EXFORGE) 10-320 MG tablet Take 1 tablet by mouth daily.   atorvastatin (LIPITOR) 40 MG tablet Take 1 tablet by mouth daily.   buPROPion (WELLBUTRIN XL) 150 MG 24 hr tablet Take 1 tablet by mouth every morning.   busPIRone (BUSPAR) 10 MG tablet Take by mouth.   glipiZIDE (GLUCOTROL) 5 MG tablet Take 5 mg by mouth 2 (two) times daily.   icosapent Ethyl (VASCEPA) 1 g capsule Take 2 g by mouth 2 (two) times daily.  JARDIANCE 25 MG TABS tablet Take 25 mg by mouth daily.   loratadine (CLARITIN) 10 MG tablet Take 10 mg by mouth daily.   metFORMIN (GLUCOPHAGE-XR) 500 MG 24 hr tablet Take 1,000 mg by mouth 2 (two) times daily.   mometasone (ELOCON) 0.1 % cream SMARTSIG:Sparingly Topical Daily   montelukast (SINGULAIR) 10 MG tablet Take 1 tablet by mouth daily.   RYBELSUS 3 MG TABS Take 1 tablet by mouth every morning. (Patient not taking: Reported on 02/03/2021)   SYNJARDY 12.09-998 MG TABS Take 2 tablets by mouth daily. (Patient not taking: Reported on 02/03/2021)   No facility-administered encounter medications on file as of 02/03/2021.  :  Review of Systems:  Out of a complete 14 point review of systems, all are reviewed and negative with the exception of these symptoms as listed below:   Review of Systems  Neurological:        Initial cpap Ibreeze set up 12-21-20.   Objective:  Neurological Exam  Physical Exam Physical Examination:   Vitals:   02/03/21 0729  BP: 113/72  Pulse: 68    General Examination: The patient is a very pleasant 35 y.o. male in no acute distress. He appears well-developed and well-nourished and well groomed.   HEENT: Normocephalic, atraumatic, pupils are equal, round and reactive to light, extraocular tracking is good without limitation to gaze excursion or nystagmus noted. Hearing is grossly intact. Face is symmetric with normal facial animation. Speech  is clear with no dysarthria noted. There is no hypophonia. There is no lip, neck/head, jaw or voice tremor. Neck is supple with full range of passive and active motion. There are no carotid bruits on auscultation. Oropharynx exam reveals: No significant mouth dryness, stable findings, tongue protrudes centrally and palate elevates symmetrically.     Chest: Clear to auscultation without wheezing, rhonchi or crackles noted.   Heart: S1+S2+0, regular and normal without murmurs, rubs or gallops noted.    Abdomen: Soft, non-tender and non-distended.   Extremities: There is Nonpitting puffiness noticed of both distal lower extremities.   Skin: Warm and dry.     Musculoskeletal: exam reveals no obvious joint deformities.    Neurologically:  Mental status: The patient is awake, alert and oriented in all 4 spheres. His immediate and remote memory, attention, language skills and fund of knowledge are appropriate. There is no evidence of aphasia, agnosia, apraxia or anomia. Speech is clear with normal prosody and enunciation. Thought process is linear. Mood is normal and affect is normal.  Cranial nerves II - XII are as described above under HEENT exam.  Motor exam: Normal bulk, strength and tone is noted. There is no tremor, fine motor skills and coordination: grossly intact.  Cerebellar testing: No dysmetria or intention tremor. There is no truncal or gait ataxia.  Sensory exam: intact to light touch in the upper and lower extremities.  Gait, station and balance: He stands easily. No veering to one side is noted. No leaning to one side is noted. Posture is age-appropriate and stance is narrow based. Gait shows normal stride length and normal pace. No problems turning are noted.                  Assessment and plan:  In summary, Francis Frank is a very pleasant 35 year old male with an underlying medical history of hyperlipidemia, hypertension, allergies, eczema, elevated liver enzymes, diabetes,  allergies, depression and obesity, who presents for follow-up consultation of his obstructive sleep apnea, which was deemed in  the mild range by home sleep testing on 11/10/2020 with an AHI of 13.3/h, O2 nadir 87%.  He has been on AutoPap therapy for about 6 weeks.  He is generally compliant with treatment but was a little bit more consistent with treatment in the first month.  He has met insurance criteria for compliance as well.  He has benefited from treatment and that his daytime energy is better, tiredness and snoring less.  He is motivated to continue with treatment.  We talked about his home sleep test results and reviewed his compliance data for the first month and the latest 30 days in comparison as well.  He is commended for his treatment adherence and reminded to be consistent with the usage of his AutoPap as he may reap more consistent benefit from it when using it regularly.  He is advised to follow-up routinely to see one of our nurse practitioners in 1 year, we can offer a MyChart video visit as well if he prefers.  He reported occasional difficulty falling asleep, he seems to be very sensitive to caffeine.  Avoids caffeine after his morning coffee.  He used to be less sensitive to caffeine's effects when he was younger.   I answered all his questions today and he was in agreement with the plan. I spent 30 minutes in total face-to-face time and in reviewing records during pre-charting, more than 50% of which was spent in counseling and coordination of care, reviewing test results, reviewing medications and treatment regimen and/or in discussing or reviewing the diagnosis of OSA, the prognosis and treatment options. Pertinent laboratory and imaging test results that were available during this visit with the patient were reviewed by me and considered in my medical decision making (see chart for details).

## 2021-02-03 NOTE — Patient Instructions (Addendum)
It was nice to see you again today.  You have done a great job using your AutoPap machine, especially in the first month.  You have fulfilled insurance criteria also for compliance.   Please talk to your DME company about replacing your supplies in a timely manner including changing the filter as discussed.  Please continue using your autoPAP regularly. While your insurance requires that you use PAP at least 4 hours each night on 70% of the nights, I recommend, that you not skip any nights and use it throughout the night if you can. Getting used to PAP and staying with the treatment long term does take time and patience and discipline. Untreated obstructive sleep apnea when it is moderate to severe can have an adverse impact on cardiovascular health and raise her risk for heart disease, arrhythmias, hypertension, congestive heart failure, stroke and diabetes. Untreated obstructive sleep apnea causes sleep disruption, nonrestorative sleep, and sleep deprivation. This can have an impact on your day to day functioning and cause daytime sleepiness and impairment of cognitive function, memory loss, mood disturbance, and problems focussing. Using PAP regularly can improve these symptoms.  Keep up the good work!  We can see you back in a years' time, you can see one of our nurse practitioners in a virtual visit also.  Feel free to call us or email [place] through MyChart for any interim questions or concerns.  We can consider rechecking your sleep apnea with another home sleep test in the future.

## 2021-03-29 ENCOUNTER — Ambulatory Visit: Payer: No Typology Code available for payment source | Admitting: Neurology

## 2022-02-15 ENCOUNTER — Telehealth: Payer: Self-pay | Admitting: Adult Health

## 2022-05-13 ENCOUNTER — Telehealth: Payer: Self-pay | Admitting: Adult Health

## 2022-07-18 ENCOUNTER — Telehealth: Payer: Self-pay | Admitting: Adult Health

## 2022-07-18 NOTE — Telephone Encounter (Signed)
MY chart Need Isurance

## 2022-07-22 ENCOUNTER — Telehealth: Payer: Self-pay | Admitting: Adult Health
